# Patient Record
Sex: Male | Born: 2000 | Race: White | Hispanic: No | Marital: Single | Smoking: Former smoker
Health system: Southern US, Community
[De-identification: ages and names within clinical notes are randomized; demographics above are authoritative.]

## PROBLEM LIST (undated history)

## (undated) DIAGNOSIS — F32A Depression, unspecified: Secondary | ICD-10-CM

## (undated) DIAGNOSIS — R011 Cardiac murmur, unspecified: Secondary | ICD-10-CM

## (undated) DIAGNOSIS — K219 Gastro-esophageal reflux disease without esophagitis: Secondary | ICD-10-CM

## (undated) DIAGNOSIS — G43909 Migraine, unspecified, not intractable, without status migrainosus: Secondary | ICD-10-CM

## (undated) HISTORY — DX: Gastro-esophageal reflux disease without esophagitis: K21.9

## (undated) HISTORY — DX: Depression, unspecified: F32.A

## (undated) HISTORY — DX: Cardiac murmur, unspecified: R01.1

## (undated) HISTORY — DX: Morbid (severe) obesity due to excess calories: E66.01

## (undated) HISTORY — DX: Migraine, unspecified, not intractable, without status migrainosus: G43.909

---
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
## 2020-05-05 LAB — LIPID PANEL
Cholesterol: 136 (ref 0–200)
HDL: 37 (ref 35–70)
LDL Cholesterol: 68
Triglycerides: 186 — AB (ref 40–160)

## 2020-05-05 LAB — HEPATIC FUNCTION PANEL
ALT: 27 (ref 3–30)
AST: 18 (ref 2–40)
Alkaline Phosphatase: 126 — AB (ref 25–125)
Bilirubin, Total: 0.7

## 2020-05-05 LAB — BASIC METABOLIC PANEL
BUN: 14 (ref 4–21)
CO2: 22 (ref 13–22)
Chloride: 103 (ref 99–108)
Creatinine: 1.1 (ref 0.6–1.3)
Glucose: 75
Potassium: 4.5 (ref 3.4–5.3)
Sodium: 139 (ref 137–147)

## 2020-05-05 LAB — CBC AND DIFFERENTIAL
HCT: 48 (ref 41–53)
Hemoglobin: 16.4 (ref 13.5–17.5)
Neutrophils Absolute: 5
Platelets: 245 (ref 150–399)
WBC: 7.1

## 2020-05-05 LAB — COMPREHENSIVE METABOLIC PANEL
Albumin: 4.9 (ref 3.5–5.0)
Calcium: 9.9 (ref 8.7–10.7)
GFR calc Af Amer: 113
GFR calc non Af Amer: 97
Globulin: 2.5

## 2020-05-05 LAB — HEMOGLOBIN A1C: Hemoglobin A1C: 5.1

## 2020-05-05 LAB — CBC: RBC: 5.69 — AB (ref 3.87–5.11)

## 2020-05-05 LAB — TSH: TSH: 1.93 (ref 0.41–5.90)

## 2020-06-14 DIAGNOSIS — Z8679 Personal history of other diseases of the circulatory system: Secondary | ICD-10-CM | POA: Insufficient documentation

## 2020-08-25 ENCOUNTER — Ambulatory Visit: Payer: Self-pay | Admitting: Family Medicine

## 2020-08-28 ENCOUNTER — Other Ambulatory Visit: Payer: Self-pay

## 2020-08-30 ENCOUNTER — Ambulatory Visit (INDEPENDENT_AMBULATORY_CARE_PROVIDER_SITE_OTHER): Payer: 59 | Admitting: Family Medicine

## 2020-08-30 ENCOUNTER — Other Ambulatory Visit: Payer: Self-pay

## 2020-08-30 ENCOUNTER — Encounter: Payer: Self-pay | Admitting: Family Medicine

## 2020-08-30 DIAGNOSIS — Z952 Presence of prosthetic heart valve: Secondary | ICD-10-CM | POA: Diagnosis not present

## 2020-08-30 DIAGNOSIS — Z0000 Encounter for general adult medical examination without abnormal findings: Secondary | ICD-10-CM

## 2020-08-30 NOTE — Progress Notes (Signed)
OFFICE VISIT  08/30/2020  CC:  Chief Complaint  Patient presents with  . Establish Care    prev. PCP Dr. Sula Rumple;     HPI:    Patient is a 19 y.o. Caucasian male who presents to establish care. Hx aortic valve replacement, HTN--has [facility].  Last visit 07/2020. He got an echo, was told all was fine with it.  No f/u was discussed.  [facility] [place], Sammuel Bailiff, MD (psychiatrist). Was on guanfacine (intuniv) in the past until 2-3 wks ago. No longer feels like he wants/needs any ADHD meds. Hx of depressed mood and high anxiety but says never persistent and never treated with meds. Describes hx of panic attacks at one point, states these would occur b/c he would constantly think about something being wrong with him--says he was "hypochondriacal".  Says all this is better now, says mood not depressed lately.  05/05/20 CBC, CMET, FLP, TSH, and a1c all reviewed today (see lab section below) and all were normal.  Long hx of morbid obesity: max wt 398, lowest wt he recalls as an adult is 333. Current 345.  Says he wants to start going back to the gym as well as eating better. Has been eating a lot of junk food lately. Says his goal wt is 240 lbs.  ROS: no fevers, no CP, no SOB, no wheezing, no cough, no dizziness, no HAs, no rashes, no melena/hematochezia.  No polyuria or polydipsia.  No myalgias or arthralgias.  No focal weakness, paresthesias, or tremors.  No acute vision or hearing abnormalities. No n/v/d or abd pain.  No palpitations.     Past Medical History:  Diagnosis Date  . Attention deficit hyperactivity disorder (ADHD) 08/14/2010  . Depression   . GERD (gastroesophageal reflux disease)   . 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 Heart murmur   . Migraine   . Morbid obesity with BMI of 45.0-49.9, adult (HCC)   . Phimosis 08/14/2017    Past Surgical History:  Procedure Laterality Date  . AORTIC VALVE  REPLACEMENT  2006    Outpatient Medications Prior to Visit  Medication Sig Dispense Refill  . amoxicillin (AMOXIL) 500 MG capsule Take 4 capsules 1 hour prior to dental work.    . guanFACINE (TENEX) 1 MG tablet Take by mouth.    . Magnesium 100 MG CAPS Take by mouth.    . MELATONIN PO Take by mouth.     No facility-administered medications prior to visit.    Allergies  Allergen Reactions  . Buspar [Buspirone] Anaphylaxis    ROS As per HPI  PE: Vitals with BMI 08/30/2020  Height 5\' 11"   Weight 345 lbs  BMI 48.14  Systolic 119  Diastolic 81  Pulse 91   Gen: Alert, well appearing.  Patient is oriented to person, place, time, and situation. AFFECT: pleasant, lucid thought and speech. : no injection, icteris, swelling, or exudate.  EOMI, PERRLA. Mouth: lips without lesion/swelling.  Oral mucosa pink and moist. Oropharynx without erythema, exudate, or swelling.  CV: RRR, normal S1 and S2, no m/r/g.   LUNGS: CTA bilat, nonlabored resps, good aeration in all lung fields. ABD: soft, ND/NT EXT: no clubbing or cyanosis.  no edema.   LABS:  Lab Results  Component Value Date   TSH 1.93 05/05/2020   Lab Results  Component Value Date   WBC 7.1 05/05/2020   HGB 16.4 05/05/2020   HCT 48 05/05/2020  PLT 245 05/05/2020   Lab Results  Component Value Date   CREATININE 1.1 05/05/2020   BUN 14 05/05/2020   NA 139 05/05/2020   K 4.5 05/05/2020   CL 103 05/05/2020   CO2 22 05/05/2020   Lab Results  Component Value Date   ALT 27 05/05/2020   AST 18 05/05/2020   ALKPHOS 126 (A) 05/05/2020   Lab Results  Component Value Date   CHOL 136 05/05/2020   Lab Results  Component Value Date   HDL 37 05/05/2020   Lab Results  Component Value Date   LDLCALC 68 05/05/2020   Lab Results  Component Value Date   TRIG 186 (A) 05/05/2020   Lab Results  Component Value Date   HGBA1C 5.1 05/05/2020   IMPRESSION AND PLAN:  New/establishing care:  1) Morbid obesity,  BMI 48. Discussed impact of this on his short and longterm mental and physical health. Encouraged pt to restart healthier diet and exercise habits but start slow and ramp up over time so he can maintain TLC over the long term. Goal wt is 240 lbs.  2) Hx of AVR, age 50 or so. Says recent echo through a cardiologist in [place] (he thinks it was a [facility] practice but he cannot recall any further specifics): pt reports he was told "all was fine" with the echo. He'll need to keep annual f/u with cardiology so they can follow him and determine if/when another surveillance echo is needed. BP good today, heart sounds normal.  3) Preventative health: His most recent CPE and labs was 04/2020. He defers covid and flu vaccines at this time.  Defers HPV vaccine as well. All other vaccines UTD.  An After Visit Summary was printed and given to the patient.  FOLLOW UP: Return in about 1 year (around 08/30/2021) for annual CPE (fasting).  Signed:  Santiago Bumpers, MD           08/30/2020

## 2020-10-11 ENCOUNTER — Other Ambulatory Visit: Payer: Self-pay

## 2020-10-16 ENCOUNTER — Encounter: Payer: Self-pay | Admitting: Family Medicine

## 2020-10-16 ENCOUNTER — Other Ambulatory Visit: Payer: Self-pay

## 2020-10-16 ENCOUNTER — Ambulatory Visit (INDEPENDENT_AMBULATORY_CARE_PROVIDER_SITE_OTHER): Payer: 59 | Admitting: Family Medicine

## 2020-10-16 VITALS — BP 112/70 | HR 89 | Temp 97.7°F | Resp 16 | Ht 71.0 in | Wt 360.4 lb

## 2020-10-16 DIAGNOSIS — R1011 Right upper quadrant pain: Secondary | ICD-10-CM | POA: Diagnosis not present

## 2020-10-16 NOTE — Progress Notes (Signed)
OFFICE VISIT  10/16/2020  CC:  Chief Complaint  Patient presents with  . Follow-up    urgent care, [facility] on 11/24   HPI:    Patient is a 19 y.o. male who presents for f/u urgent care visit 10/11/20 (5 d/a). Went in for 2-3 d hx of some chest bubbly sx's, tightness in upper abd, burping.  Vague sensation of arms feeling tight.  Says he got chest x-ray and ekg was told nothing of concern was noted on these.  GER med rx'd by UC caused side effects "feeling loopy" on 1 dose so he stopped it. He no longer has the chest bubbly/tightness. Has also had RUQ pain on/off last 10d or so.  Random, not postprandial.  No nausea or vomiting.  Pain doesn't seem to last long.  No trauma to the area.  No rash.   Appetite good.  Has normal/formed bm qod, no diarrhea. No fevers.  ROS: no fevers, no CP, no SOB, no wheezing, no cough, no dizziness, no HAs, no rashes, no melena/hematochezia.  No polyuria or polydipsia.  No myalgias or arthralgias.  No focal weakness, paresthesias, or tremors.  No acute vision or hearing abnormalities. No palpitations.    Past Medical History:  Diagnosis Date  . Attention deficit hyperactivity disorder (ADHD) 08/14/2010  . Depression   . GERD (gastroesophageal reflux disease)   . 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 Heart murmur   . Migraine   . Morbid obesity with BMI of 45.0-49.9, adult (HCC)   . Phimosis 08/14/2017    Past Surgical History:  Procedure Laterality Date  . AORTIC VALVE REPLACEMENT  2006    Outpatient Medications Prior to Visit  Medication Sig Dispense Refill  . famotidine (PEPCID) 20 MG tablet Take by mouth. (Patient not taking: Reported on 10/16/2020)    . omeprazole (PRILOSEC) 20 MG capsule Take by mouth. (Patient not taking: Reported on 10/16/2020)     No facility-administered medications prior to visit.    Allergies  Allergen Reactions  . Buspar [Buspirone] Anaphylaxis    ROS As per  HPI  PE: Vitals with BMI 10/16/2020 08/30/2020  Height 5\' 11"  5\' 11"   Weight 360 lbs 6 oz 345 lbs  BMI 50.29 48.14  Systolic 112 119  Diastolic 70 81  Pulse 89 91     Gen: Alert, well appearing.  Patient is oriented to person, place, time, and situation. AFFECT: pleasant, lucid thought and speech. CV: RRR, no m/r/g.   LUNGS: CTA bilat, nonlabored resps, good aeration in all lung fields. ABD: soft, rotund but non-distended.  Mild RUQ TTP w/out rebound or guarding.  Neg murphy's sign.   ND, BS normal.  No hepatospenomegaly or mass.  No bruits. EXT: no clubbing or cyanosis.  no edema.   LABS:   none  IMPRESSION AND PLAN:  1) Epigastric/chest discomfort: question atypical dyspepsia or GERD sx's. Fortunately this has spontaneously resolved.   Obs.  2) RUQ pain, does not fit clearly with symptomatic gallstones or acute cholecystitis. Suspect abd wall etiology. However, he is definitely at higher risk for gallstones given his morbid obesity. Will check cbc, cmet, and RUQ abd ultrasound.  An After Visit Summary was printed and given to the patient.  FOLLOW UP: Return for to be determined based on results of w/u.  Signed:  , MD           10/16/2020

## 2020-10-17 LAB — CBC WITH DIFFERENTIAL/PLATELET
Basophils Absolute: 0.1 10*3/uL (ref 0.0–0.1)
Basophils Relative: 1.3 % (ref 0.0–3.0)
Eosinophils Absolute: 0.3 10*3/uL (ref 0.0–0.7)
Eosinophils Relative: 3.5 % (ref 0.0–5.0)
HCT: 46.1 % (ref 36.0–49.0)
Hemoglobin: 15.8 g/dL (ref 12.0–16.0)
Lymphocytes Relative: 19.5 % — ABNORMAL LOW (ref 24.0–48.0)
Lymphs Abs: 1.4 10*3/uL (ref 0.7–4.0)
MCHC: 34.3 g/dL (ref 31.0–37.0)
MCV: 85.8 fl (ref 78.0–98.0)
Monocytes Absolute: 0.8 10*3/uL (ref 0.1–1.0)
Monocytes Relative: 11 % (ref 3.0–12.0)
Neutro Abs: 4.8 10*3/uL (ref 1.4–7.7)
Neutrophils Relative %: 64.7 % (ref 43.0–71.0)
Platelets: 240 10*3/uL (ref 150.0–575.0)
RBC: 5.37 Mil/uL (ref 3.80–5.70)
RDW: 13.6 % (ref 11.4–15.5)
WBC: 7.4 10*3/uL (ref 4.5–13.5)

## 2020-10-17 LAB — COMPREHENSIVE METABOLIC PANEL
ALT: 23 U/L (ref 0–53)
AST: 18 U/L (ref 0–37)
Albumin: 4.3 g/dL (ref 3.5–5.2)
Alkaline Phosphatase: 103 U/L (ref 52–171)
BUN: 14 mg/dL (ref 6–23)
CO2: 33 mEq/L — ABNORMAL HIGH (ref 19–32)
Calcium: 10 mg/dL (ref 8.4–10.5)
Chloride: 100 mEq/L (ref 96–112)
Creatinine, Ser: 0.87 mg/dL (ref 0.40–1.50)
GFR: 125.16 mL/min (ref >60.00)
Glucose, Bld: 72 mg/dL (ref 70–99)
Potassium: 3.8 mEq/L (ref 3.5–5.1)
Sodium: 139 mEq/L (ref 135–145)
Total Bilirubin: 0.5 mg/dL (ref 0.2–1.2)
Total Protein: 7.5 g/dL (ref 6.0–8.3)

## 2020-10-19 ENCOUNTER — Ambulatory Visit: Payer: 59

## 2020-10-19 ENCOUNTER — Other Ambulatory Visit: Payer: Self-pay | Admitting: Family Medicine

## 2020-10-19 ENCOUNTER — Ambulatory Visit (INDEPENDENT_AMBULATORY_CARE_PROVIDER_SITE_OTHER): Payer: 59

## 2020-10-19 ENCOUNTER — Other Ambulatory Visit: Payer: Self-pay

## 2020-10-19 DIAGNOSIS — R1011 Right upper quadrant pain: Secondary | ICD-10-CM

## 2021-01-10 DIAGNOSIS — G4733 Obstructive sleep apnea (adult) (pediatric): Secondary | ICD-10-CM | POA: Insufficient documentation

## 2021-05-12 ENCOUNTER — Other Ambulatory Visit: Payer: Self-pay

## 2021-05-12 ENCOUNTER — Emergency Department (HOSPITAL_BASED_OUTPATIENT_CLINIC_OR_DEPARTMENT_OTHER)
Admission: EM | Admit: 2021-05-12 | Discharge: 2021-05-12 | Disposition: A | Discharge status: Home / Self Care | Payer: 59 | Attending: Emergency Medicine | Admitting: Emergency Medicine

## 2021-05-12 ENCOUNTER — Emergency Department (HOSPITAL_BASED_OUTPATIENT_CLINIC_OR_DEPARTMENT_OTHER): Payer: 59

## 2021-05-12 ENCOUNTER — Encounter (HOSPITAL_BASED_OUTPATIENT_CLINIC_OR_DEPARTMENT_OTHER): Payer: Self-pay | Admitting: Emergency Medicine

## 2021-05-12 DIAGNOSIS — F17220 Nicotine dependence, chewing tobacco, uncomplicated: Secondary | ICD-10-CM | POA: Insufficient documentation

## 2021-05-12 DIAGNOSIS — E039 Hypothyroidism, unspecified: Secondary | ICD-10-CM | POA: Diagnosis not present

## 2021-05-12 DIAGNOSIS — Z28310 Unvaccinated for covid-19: Secondary | ICD-10-CM | POA: Insufficient documentation

## 2021-05-12 DIAGNOSIS — Z20822 Contact with and (suspected) exposure to covid-19: Secondary | ICD-10-CM | POA: Insufficient documentation

## 2021-05-12 DIAGNOSIS — R059 Cough, unspecified: Secondary | ICD-10-CM | POA: Diagnosis present

## 2021-05-12 DIAGNOSIS — J40 Bronchitis, not specified as acute or chronic: Secondary | ICD-10-CM | POA: Insufficient documentation

## 2021-05-12 LAB — RESP PANEL BY RT-PCR (FLU A&B, COVID) ARPGX2
Influenza A by PCR: NEGATIVE
Influenza B by PCR: NEGATIVE
SARS Coronavirus 2 by RT PCR: NEGATIVE

## 2021-05-12 MED ORDER — PREDNISONE 20 MG PO TABS: 40.0000 mg | ORAL_TABLET | Freq: Every day | ORAL | 0 refills | Status: AC

## 2021-05-12 MED ORDER — ALBUTEROL SULFATE HFA 108 (90 BASE) MCG/ACT IN AERS
2.0000 | INHALATION_SPRAY | Freq: Once | RESPIRATORY_TRACT | Status: AC
  Administered 2021-05-12: 2 via RESPIRATORY_TRACT
  Filled 2021-05-12: qty 6.7

## 2021-05-12 NOTE — ED Triage Notes (Signed)
Reports SOB, weakness, fatigue, occasional cough, and sore throat from coughing.

## 2021-05-12 NOTE — Discharge Instructions (Addendum)
Your history and physical exam is suggestive of bronchitis.  You have been tested for COVID-19 and influenza which are still pending.  Chest x-ray is without any evidence of pneumonia.  You do not need to be treated with antibiotics.  Instead, continue with albuterol every 4 hours as needed.  You will need to discontinue smoking, at least until your symptoms improve.  I have also prescribed you a short prednisone burst which you can take to help with your bronchial inflammation.

## 2021-05-12 NOTE — Progress Notes (Signed)
RN gave albuterol treatment to patient.

## 2021-05-12 NOTE — ED Provider Notes (Signed)
[facility] EMERGENCY DEPARTMENT Provider Note   CSN: [id number] Arrival date & time: 05/12/21  1836     History Chief Complaint  Patient presents with   Shortness of Breath    Marco Davis is a 20 y.o. male with no relevant past medical history presents the ED with complaints of cold symptoms.  On my examination, patient states that for the past 10 days he has been experiencing cold symptoms.  He endorses headache, congestion, generalized malaise, and shortness of breath in the context of a nonproductive cough.  He denies any fevers, chills, abdominal pain, dysuria, room spinning dizziness, numbness or weakness, hemoptysis, unilateral extremity swelling or edema, history of clots or clotting disorder, or other symptoms.  He states that it started after he went to his 54-year-old cousin's birthday party.  Denies any obvious sick contacts though.  He is not immunized for COVID-19.  He is traveling to [place] tomorrow and wanted to get checked out.  Patient smokes vapes and marijuana on daily basis.  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   Heart murmur    Migraine    Morbid obesity with BMI of 45.0-49.9, adult (HCC)    Phimosis 08/14/2017    Patient Active Problem List   Diagnosis Date Noted   History of aortic valve disorder 06/14/2020   Acquired hypothyroidism 05/17/2019   Phimosis 08/14/2017   Attention deficit hyperactivity disorder (ADHD) 08/14/2010   Obesity, unspecified 01/02/2009    Past Surgical History:  Procedure Laterality Date   AORTIC VALVE REPLACEMENT  2006       Family History  Problem Relation Age of Onset   Hypertension Mother    Arthritis Maternal Grandmother    Hypertension Maternal Grandmother    COPD Maternal Grandmother    Mental  illness Maternal Grandmother    Heart disease Maternal Grandfather    Breast cancer Paternal Grandmother    COPD Paternal Grandmother     Social History   Tobacco Use   Smoking status: Never   Smokeless tobacco: Current    Types: Chew  Vaping Use   Vaping Use: Former   Quit date: 07/31/2020  Substance Use Topics   Alcohol use: Yes    Alcohol/week: 2.0 standard drinks    Types: 2 Cans of beer per week   Drug use: Yes    Types: Marijuana    Comment: former LSD    Home Medications Prior to Admission medications   Medication Sig Start Date End Date Taking? Authorizing Provider  predniSONE (DELTASONE) 20 MG tablet Take 2 tablets (40 mg total) by mouth daily with breakfast for 5 days. 05/12/21 05/17/21 Yes Lorelee New, PA-C  famotidine (PEPCID) 20 MG tablet Take by mouth. Patient not taking: Reported on 10/16/2020 10/11/20 11/10/20  [provider]  omeprazole (PRILOSEC) 20 MG capsule Take by mouth. Patient not taking: Reported on 10/16/2020 10/11/20 11/10/20  [provider]    Allergies    Buspar [buspirone]  Review of Systems   Review of Systems  All other systems reviewed and are negative.  Physical Exam Updated Vital Signs BP 120/75 (BP Location: Right Arm)   Pulse 68   Temp 98.4 F (36.9 C) (Oral)   Resp 18   Ht 6\' 3"  (1.905 m)   Wt )  149.7 kg   SpO2 98%   BMI 41.25 kg/m   Physical Exam Vitals and nursing note reviewed. Exam conducted with a chaperone present.  Constitutional:      General: He is not in acute distress.    Appearance: Normal appearance. He is not toxic-appearing.  HENT:     Head: Normocephalic and atraumatic.  Eyes:     General: No scleral icterus.    Conjunctiva/sclera: Conjunctivae normal.  Cardiovascular:     Rate and Rhythm: Normal rate.     Pulses: Normal pulses.  Pulmonary:     Effort: Pulmonary effort is normal. No respiratory distress.     Breath sounds: Wheezing present. No rales.     Comments: Mild  wheezing auscultated bilaterally.  No increased work of breathing.  No tachypnea. Musculoskeletal:     Right lower leg: No edema.     Left lower leg: No edema.  Skin:    General: Skin is dry.  Neurological:     Mental Status: He is alert and oriented to person, place, and time.     GCS: GCS eye subscore is 4. GCS verbal subscore is 5. GCS motor subscore is 6.  Psychiatric:        Mood and Affect: Mood normal.        Behavior: Behavior normal.        Thought Content: Thought content normal.    ED Results / Procedures / Treatments   Labs (all labs ordered are listed, but only abnormal results are displayed) Labs Reviewed  RESP PANEL BY RT-PCR (FLU A&B, COVID) [id number]    EKG None  Radiology DG Chest Portable 1 View  Result Date: 05/12/2021 CLINICAL DATA:  Shortness of breath, cough, weakness and fatigue EXAM: PORTABLE CHEST 1 VIEW COMPARISON:  None. FINDINGS: Evidence of remote prior sternotomy with multiple sternal sutures. Linear radiodensity projecting in the region of the aortic valve likely corresponding to prior aortic valve replacement. Remaining cardiomediastinal contours are unremarkable. No consolidation, features of edema, pneumothorax, or effusion. No acute osseous or soft tissue abnormality. IMPRESSION: No acute cardiopulmonary abnormality. Evidence of prior sternotomy and aortic valve replacement. Electronically Signed   By: Kreg Shropshire M.D.   On: 05/12/2021 20:00    Procedures Procedures   Medications Ordered in ED Medications  albuterol (VENTOLIN HFA) 108 (90 Base) MCG/ACT inhaler 2 puff (2 puffs Inhalation Given 05/12/21 1928)    ED Course  I have reviewed the triage vital signs and the nursing notes.  Pertinent labs & imaging results that were available during my care of the patient were reviewed by me and considered in my medical decision making (see chart for details).    MDM Rules/Calculators/A&P                          Marco Davis was evaluated in  Emergency Department on 05/12/2021 for the symptoms described in the history of present illness. He was evaluated in the context of the global COVID-19 pandemic, which necessitated consideration that the patient might be at risk for infection with the SARS-CoV-2 virus that causes COVID-19. Institutional protocols and algorithms that pertain to the evaluation of patients at risk for COVID-19 are in a state of rapid change based on information released by regulatory bodies including the CDC and federal and state organizations. These policies and algorithms were followed during the patient's care in the ED.  I personally reviewed patient's medical chart and all notes from triage and  staff during today's encounter. I have also ordered and reviewed all labs and imaging that I felt to be medically necessary in the evaluation of this patient's complaints and with consideration of their physical exam. If needed, translation services were available and utilized.   Patient with 10-day history of cold symptoms.  He continues to smoke on a regular, daily basis.  There was mild wheezing appreciated my exam.  Given chronicity of illness, will obtain chest x-ray.  Patient is unvaccinated, will also obtain COVID-19 testing.  He denies any fevers, chills, or difficulty tolerating p.o. intake.  Do not feel as though further laboratory work-up would yield any significant findings.  Patient is PERC negative.     Chest x-ray is personally reviewed and demonstrates no acute cardiopulmonary disease.  Given patient's mild wheezing auscultated bilaterally, will treat with albuterol here in the ED.  We will discharge him home with continued albuterol use in addition to short prednisone burst.  I emphasized to him that antibiotics are not warranted and would not help with his recovery.    I also told patient to abstain from continued smoking until his symptoms improve.  This is likely contributing to his persistent shortness of  breath and wheezing.    On subsequent evaluation, he is feeling improved after albuterol here in the ED.  He has used it in the past and feels comfortable continuing treatments at home.  He can follow-up with his primary care provider for ongoing evaluation and management.  ER return precautions discussed.  Patient voices understanding and is agreeable to the plan.  Final Clinical Impression(s) / ED Diagnoses Final diagnoses:  Bronchitis    Rx / DC Orders ED Discharge Orders          Ordered    predniSONE (DELTASONE) 20 MG tablet  Daily with breakfast        05/12/21 2005             Lorelee New, PA-C 05/12/21 2005    Little, Ambrose Finland, MD 05/12/21 2320

## 2021-06-19 ENCOUNTER — Emergency Department (HOSPITAL_BASED_OUTPATIENT_CLINIC_OR_DEPARTMENT_OTHER): Payer: 59

## 2021-06-19 ENCOUNTER — Emergency Department (HOSPITAL_BASED_OUTPATIENT_CLINIC_OR_DEPARTMENT_OTHER)
Admission: EM | Admit: 2021-06-19 | Discharge: 2021-06-19 | Disposition: A | Discharge status: Home / Self Care | Payer: 59 | Attending: Emergency Medicine | Admitting: Emergency Medicine

## 2021-06-19 ENCOUNTER — Other Ambulatory Visit: Payer: Self-pay

## 2021-06-19 ENCOUNTER — Encounter (HOSPITAL_BASED_OUTPATIENT_CLINIC_OR_DEPARTMENT_OTHER): Payer: Self-pay

## 2021-06-19 ENCOUNTER — Telehealth: Payer: Self-pay

## 2021-06-19 DIAGNOSIS — R600 Localized edema: Secondary | ICD-10-CM | POA: Diagnosis present

## 2021-06-19 DIAGNOSIS — Z87891 Personal history of nicotine dependence: Secondary | ICD-10-CM | POA: Insufficient documentation

## 2021-06-19 DIAGNOSIS — R0789 Other chest pain: Secondary | ICD-10-CM | POA: Diagnosis not present

## 2021-06-19 DIAGNOSIS — R233 Spontaneous ecchymoses: Secondary | ICD-10-CM | POA: Diagnosis not present

## 2021-06-19 DIAGNOSIS — M7989 Other specified soft tissue disorders: Secondary | ICD-10-CM

## 2021-06-19 DIAGNOSIS — R079 Chest pain, unspecified: Secondary | ICD-10-CM

## 2021-06-19 DIAGNOSIS — E039 Hypothyroidism, unspecified: Secondary | ICD-10-CM | POA: Diagnosis not present

## 2021-06-19 LAB — CBC WITH DIFFERENTIAL/PLATELET
Abs Immature Granulocytes: 0.06 10*3/uL (ref 0.00–0.07)
Basophils Absolute: 0 10*3/uL (ref 0.0–0.1)
Basophils Relative: 0 %
Eosinophils Absolute: 0.2 10*3/uL (ref 0.0–0.5)
Eosinophils Relative: 2 %
HCT: 45.3 % (ref 39.0–52.0)
Hemoglobin: 15.2 g/dL (ref 13.0–17.0)
Immature Granulocytes: 1 %
Lymphocytes Relative: 24 %
Lymphs Abs: 1.6 10*3/uL (ref 0.7–4.0)
MCH: 29.3 pg (ref 26.0–34.0)
MCHC: 33.6 g/dL (ref 30.0–36.0)
MCV: 87.5 fL (ref 80.0–100.0)
Monocytes Absolute: 0.6 10*3/uL (ref 0.1–1.0)
Monocytes Relative: 8 %
Neutro Abs: 4.2 10*3/uL (ref 1.7–7.7)
Neutrophils Relative %: 65 %
Platelets: 257 10*3/uL (ref 150–400)
RBC: 5.18 MIL/uL (ref 4.22–5.81)
RDW: 13.1 % (ref 11.5–15.5)
WBC: 6.6 10*3/uL (ref 4.0–10.5)
nRBC: 0 % (ref 0.0–0.2)

## 2021-06-19 LAB — COMPREHENSIVE METABOLIC PANEL
ALT: 30 U/L (ref 0–44)
AST: 23 U/L (ref 15–41)
Albumin: 4 g/dL (ref 3.5–5.0)
Alkaline Phosphatase: 86 U/L (ref 38–126)
Anion gap: 7 (ref 5–15)
BUN: 12 mg/dL (ref 6–20)
CO2: 28 mmol/L (ref 22–32)
Calcium: 9.2 mg/dL (ref 8.9–10.3)
Chloride: 102 mmol/L (ref 98–111)
Creatinine, Ser: 0.85 mg/dL (ref 0.61–1.24)
GFR, Estimated: >60 mL/min (ref >60)
Glucose, Bld: 85 mg/dL (ref 70–99)
Potassium: 4 mmol/L (ref 3.5–5.1)
Sodium: 137 mmol/L (ref 135–145)
Total Bilirubin: 0.5 mg/dL (ref 0.3–1.2)
Total Protein: 7.3 g/dL (ref 6.5–8.1)

## 2021-06-19 LAB — D-DIMER, QUANTITATIVE: D-Dimer, Quant: 0.32 ug/mL-FEU (ref 0.00–0.50)

## 2021-06-19 LAB — TROPONIN I (HIGH SENSITIVITY): Troponin I (High Sensitivity): 4 ng/L (ref <18)

## 2021-06-19 NOTE — ED Triage Notes (Signed)
Pt c/o CP x today-swelling to right LE x 5 days-NAD-steady gait

## 2021-06-19 NOTE — ED Notes (Signed)
Went in to discharge pt. Gown and monitoring devices lying on bed. Pt not in room

## 2021-06-19 NOTE — Telephone Encounter (Signed)
Pt currently at [facility] ED, still has scheduled appt with PCP for 8/3  [facility] Day - Client TELEPHONE ADVICE RECORD [employer] Patient Name: Marco Davis Gender: Male DOB: May 16, 2001 Age: 20 Y 1 M 5 D Return Phone Number: [phone] (Primary) Address: City/ State/ Zip: [place]  [address] Client [facility] Day - Client Client Site [place] [facility] [place] - Day Contact Type Call Who Is Calling Patient / Member / Family / Caregiver Call Type Triage / Clinical Relationship To Patient Self Return Phone Number [phone] (Primary) Chief Complaint Leg Swelling And Edema Reason for Call Symptomatic / Request for Health Information Initial Comment Caller states that he has swelling in his right leg. Translation No Nurse Assessment Nurse: Marco Luz, RN, Marco Davis Date/Time (Eastern Time): 06/19/2021 2:45:07 PM Confirm and document reason for call. If symptomatic, describe symptoms. ---Caller states that he has swelling in his right leg. Has red and purple spots along Rt shin. No injury to report. Limited to anterior LLE. Does the patient have any new or worsening symptoms? ---Yes Will a triage be completed? ---Yes Related visit to physician within the last 2 weeks? ---No Does the PT have any chronic conditions? (i.e. diabetes, asthma, this includes High risk factors for pregnancy, etc.) ---Yes List chronic conditions. ---Hx/o aortic valve replaced in childhood. Is this a behavioral health or substance abuse call? ---No Guidelines Guideline Title Affirmed Question Affirmed Notes Nurse Date/Time Marco Davis Time) Leg Swelling and Edema [1] MODERATE leg swelling (e.g., swelling extends up to knees) AND [2] new-onset or worsening Marco Davis, RNAurther Davis 06/19/2021 2:49:16 PM Disp. Time Marco Davis Time) Disposition Final User 06/19/2021 2:32:13 PM Attempt made - message left Bogard, RN, Marco Davis 06/19/2021 2:55:56 PM See PCP within  24 Hours Yes Bogard, RN, Marco Davis PLEASE NOTE: All timestamps contained within this report are represented as [place] Standard Time. CONFIDENTIALTY NOTICE: This fax transmission is intended only for the addressee. It contains information that is legally privileged, confidential or otherwise protected from use or disclosure. If you are not the intended recipient, you are strictly prohibited from reviewing, disclosing, copying using or disseminating any of this information or taking any action in reliance on or regarding this information. If you have received this fax in error, please notify us immediately by telephone so that we can arrange for its return to [place]. Phone: [phone], Toll-Free: [phone], Fax: [phone] Page: 2 of 2 Call Id: [id number] Caller Disagree/Comply Comply Caller Understands Yes PreDisposition Did not know what to do Care Advice Given Per Guideline * IF OFFICE WILL BE OPEN: You need to be examined within the next 24 hours. Call your doctor (or NP/PA) when the office opens and make an appointment. LEG SWELLING OR EDEMA: * Elevate your legs or try to lie down one or two times a day for 20 minutes. * Avoid socks with an elastic band at the top. * Wear comfortable shoes. CALL BACK IF: * Breathing difficulty or chest pain occurs * You become worse SEE PCP WITHIN 24 HOURS: Referrals REFERRED TO PCP OFFICE

## 2021-06-19 NOTE — ED Provider Notes (Signed)
[facility] EMERGENCY DEPARTMENT Provider Note   CSN: [id number] Arrival date & time: 06/19/21  1532     History Chief Complaint  Patient presents with   Chest Pain   Leg Swelling    Marco Davis is a 20 y.o. male.  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  Patient reports that this is been progressively getting worse for the past 5 days.  Patient reports some bruising to the area.  Patient was concerned possible blood clot.  No injuries that he knows of to the area.  Patient was reports yesterday he developed some left-sided chest pain that is worse with palpation of the anterior chest wall.  Patient reports that he is not have any pain currently.  Denies any cough or fevers.  No nausea or vomiting.  No history of blood clots.  Patient denies any tobacco use.  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   Heart murmur    Migraine    Morbid obesity with BMI of 45.0-49.9, adult (HCC)    Phimosis 08/14/2017    Patient Active Problem List   Diagnosis Date Noted   Mild obstructive sleep apnea 01/10/2021   History of aortic valve disorder 06/14/2020   Acquired hypothyroidism 05/17/2019   Phimosis 08/14/2017   Attention deficit hyperactivity disorder (ADHD) 08/14/2010   Obesity, unspecified 01/02/2009    Past Surgical History:  Procedure Laterality Date   AORTIC VALVE REPLACEMENT  2006       Family History  Problem Relation Age of  Onset   Hypertension Mother    Arthritis Maternal Grandmother    Hypertension Maternal Grandmother    COPD Maternal Grandmother    Mental illness Maternal Grandmother    Heart disease Maternal Grandfather    Breast cancer Paternal Grandmother    COPD Paternal Grandmother     Social History   Tobacco Use   Smoking status: Former   Smokeless tobacco: Former  Building services engineerVaping Use   Vaping Use: Former   Quit date: 07/31/2020  Substance Use Topics   Alcohol use: Yes    Alcohol/week: 2.0 standard drinks    Types: 2 Cans of beer per week    Comment: weekly   Drug use: Yes    Types: Marijuana    Home Medications Prior to Admission medications   Medication Sig Start Date End Date Taking? Authorizing Provider  cetirizine (ZYRTEC) 10 MG tablet Take by mouth. 02/08/21   [provider]  famotidine (PEPCID) 20 MG tablet Take by mouth. Patient not taking: Reported on 10/16/2020 10/11/20 11/10/20  [provider]  fluticasone (FLONASE) 50 MCG/ACT nasal spray Place into the nose. 02/08/21   [provider]  omeprazole (PRILOSEC) 20 MG capsule Take by mouth. Patient not taking: Reported on 10/16/2020 10/11/20 11/10/20  [provider]    Allergies    Buspar [buspirone]  Review of Systems   Review of Systems  Constitutional:  Negative for chills, diaphoresis and fever.  HENT:  Negative for congestion.  Eyes:  Negative for discharge.  Respiratory:  Negative for cough and shortness of breath.   Cardiovascular:  Positive for chest pain and leg swelling. Negative for palpitations.  Gastrointestinal:  Negative for abdominal pain, diarrhea, nausea and vomiting.  Musculoskeletal:  Negative for arthralgias.  Skin:  Negative for color change.  Neurological:  Negative for headaches.  Psychiatric/Behavioral:  Negative for confusion.    Physical Exam Updated Vital Signs BP 125/75 (BP Location: Left Arm)   Pulse 65   Temp 98.3 F (36.8 C) (Oral)   Resp 18   Ht  6\' 3"  (1.905 m)   Wt (!) 150.6 kg   SpO2 95%   BMI 41.50 kg/m   Physical Exam Vitals and nursing note reviewed.  Constitutional:      General: He is not in acute distress.    Appearance: He is well-developed. He is not ill-appearing or toxic-appearing.  HENT:     Head: Normocephalic and atraumatic.  Eyes:     General: No scleral icterus.       Right eye: No discharge.        Left eye: No discharge.  Cardiovascular:     Pulses:          Radial pulses are 2+ on the right side and 2+ on the left side.       Dorsalis pedis pulses are 2+ on the right side and 2+ on the left side.     Heart sounds: No murmur heard.   No friction rub. No gallop.  Pulmonary:     Effort: Pulmonary effort is normal. No respiratory distress.     Breath sounds: Normal breath sounds.  Musculoskeletal:        General: Normal range of motion.     Cervical back: Normal range of motion.     Comments: Patient does have some mild edema of the right lower extremity.  There is no calf tenderness palpable cord.  Does have some mild ecchymosis over the anterior tib-fib.  Patient has no erythema or warmth that I can appreciate.  DP pulses are 2+.  Sensation is intact.  Full range of motion of all joints  Skin:    General: Skin is warm and dry.     Capillary Refill: Capillary refill takes less than 2 seconds.     Coloration: Skin is not pale.  Neurological:     Mental Status: He is alert.  Psychiatric:        Behavior: Behavior normal.        Thought Content: Thought content normal.        Judgment: Judgment normal.    ED Results / Procedures / Treatments   Labs (all labs ordered are listed, but only abnormal results are displayed) Labs Reviewed  CBC WITH DIFFERENTIAL/PLATELET  COMPREHENSIVE METABOLIC PANEL  D-DIMER, QUANTITATIVE  TROPONIN I (HIGH SENSITIVITY)  TROPONIN I (HIGH SENSITIVITY)    EKG EKG Interpretation  Date/Time:  Tuesday June 19 2021 15:42:38 EDT Ventricular Rate:  63 PR  Interval:  166 QRS Duration: 94 QT Interval:  404 QTC Calculation: 413 R Axis:   51 Text Interpretation: Normal sinus rhythm Normal ECG No old tracing to compare Confirmed by [id number] [phone]) on 06/19/2021 4:39:07 PM  Radiology DG Chest 2 View  Result Date: 06/19/2021 CLINICAL DATA:  Chest pain. Patient reports right lower extremity swelling. EXAM: CHEST - 2 VIEW COMPARISON:  Radiograph 05/12/2021 FINDINGS: Remote median sternotomy. Upper normal heart size with stable mediastinal contours. No pulmonary edema,  pleural effusion, focal airspace disease or pneumothorax. No acute osseous abnormalities are seen. IMPRESSION: 1. No acute chest findings. 2. Post remote median sternotomy. Electronically Signed   By: Narda Rutherford M.D.   On: 06/19/2021 19:17   US Venous Img Lower Unilateral Right  Result Date: 06/19/2021 CLINICAL DATA:  Right leg swelling and pain. EXAM: RIGHT LOWER EXTREMITY VENOUS DOPPLER ULTRASOUND TECHNIQUE: Gray-scale sonography with compression, as well as color and duplex ultrasound, were performed to evaluate the deep venous system(s) from the level of the common femoral vein through the popliteal and proximal calf veins. COMPARISON:  None. FINDINGS: VENOUS Normal compressibility of the common femoral, superficial femoral, and popliteal veins, as well as the visualized calf veins. Visualized portions of profunda femoral vein and great saphenous vein unremarkable. No filling defects to suggest DVT on grayscale or color Doppler imaging. Doppler waveforms show normal direction of venous flow, normal respiratory plasticity and response to augmentation. Limited views of the contralateral common femoral vein are unremarkable. OTHER None. Limitations: none IMPRESSION: No evidence of right lower extremity deep venous thrombosis. Electronically Signed   By: Jeronimo Greaves M.D.   On: 06/19/2021 18:43    Procedures Procedures   Medications Ordered in ED Medications - No data to display  ED  Course  I have reviewed the triage vital signs and the nursing notes.  Pertinent labs & imaging results that were available during my care of the patient were reviewed by me and considered in my medical decision making (see chart for details).    MDM Rules/Calculators/A&P                           20 year old presents for right leg pain and swelling.  Patient also ports some intermittent left-sided chest pain.  Patient does have cardiac history documented above.  On exam patient is very well-appearing.  Neurovascularly intact in all extremities.  Patient does have some mild edema of the right calf but no erythema, warmth.  The ultrasound was obtained that shows no evidence of DVT to the right lower extremity.  Patient had EKG that was reviewed shows normal sinus rhythm no ischemic changes appreciated there is no signs of any worrisome arrhythmias or prolonged intervals.  Chest x-ray was reviewed that shows no evidence of focal infiltrate concerning for pneumonia.  I have this patient for ACS at this time.  No signs of fluid overloaded including bilateral pitting edema or pulmonary edema.  Unsure of the etiology of patient's right leg swelling.  May be lymphedema.  Patient was encouraged to use compression stockings and elevation at home.  D-dimer was negative.  Low suspicion for PE.  Patient reports this chest pain is completely resolved.  Encourage patient to follow-up with his cardiologist and primary care doctor.  Patient's lower extremities not appear to be cellulitic in nature.  No indication for antibiotics.  Pt is hemodynamically stable, in NAD, & able to ambulate in the ED. Evaluation does not show pathology that would require ongoing emergent intervention or inpatient treatment. I explained the diagnosis to the patient. Pain has been managed & has no complaints prior to dc. Pt is comfortable with above plan and is stable for discharge at this time. All questions were answered prior to  disposition. Strict return precautions for f/u to the ED were discussed. Encouraged follow up with PCP.  Final Clinical Impression(s) / ED Diagnoses Final diagnoses:  Right leg swelling  Left-sided chest pain  Rx / DC Orders ED Discharge Orders     None        Wallace Keller 06/19/21 1937    Melene Plan, DO 06/19/21 2300

## 2021-06-19 NOTE — Discharge Instructions (Addendum)
Your work-up was reassuring today.  No signs of heart attack or blood clot.  Use the compression stockings help with your leg swelling.  Elevate as well.  If you have any worsening chest pain or shortness breath return to the ER make she follow-up with a cardiologist.

## 2021-06-20 ENCOUNTER — Ambulatory Visit: Payer: 59 | Admitting: Family Medicine

## 2021-06-20 NOTE — Progress Notes (Deleted)
OFFICE VISIT  06/20/2021  CC: No chief complaint on file.  HPI:    Patient is a 20 y.o. male who presents for "leg swelling". ED visit documentation for same complaint 06/19/21 reviewed today. CBC, CMET, troponins, D dimer and CXR all normal.  LE venous doppler u/s NEG for DVT.    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     famotidine (PEPCID) 20 MG tablet Take by mouth. (Patient not taking: Reported on 10/16/2020)     fluticasone (FLONASE) 50 MCG/ACT nasal spray Place into the nose.     omeprazole (PRILOSEC) 20 MG capsule Take by mouth. (Patient not taking: Reported on 10/16/2020)     No facility-administered medications prior to visit.    Allergies  Allergen Reactions   Buspar [Buspirone] Anaphylaxis    ROS As per HPI  PE: Vitals with BMI 06/19/2021 06/19/2021 06/19/2021  Height - - -  Weight - - -  BMI - - -  Systolic 122 117 892  Diastolic 77 69 81  Pulse 74 65 66     ***  LABS:    Chemistry      Component Value Date/Time   NA 137 06/19/2021 1700   NA 139 05/05/2020 0000   K 4.0 06/19/2021 1700   CL 102 06/19/2021 1700   CO2 28 06/19/2021 1700   BUN 12 06/19/2021 1700   BUN 14 05/05/2020 0000   CREATININE 0.85 06/19/2021 1700   GLU 75 05/05/2020 0000      Component Value Date/Time   CALCIUM 9.2 06/19/2021 1700   ALKPHOS 86 06/19/2021 1700   AST 23 06/19/2021 1700   ALT 30 06/19/2021 1700   BILITOT 0.5 06/19/2021 1700     Lab Results  Component Value  Date   DDIMER 0.32 06/19/2021   Lab Results  Component Value Date   WBC 6.6 06/19/2021   HGB 15.2 06/19/2021   HCT 45.3 06/19/2021   MCV 87.5 06/19/2021   PLT 257 06/19/2021    IMPRESSION AND PLAN:  No problem-specific Assessment & Plan notes found for this encounter.   An After Visit Summary was printed and given to the patient.  FOLLOW UP: No follow-ups on file.  Signed:  Santiago Bumpers, MD           06/20/2021

## 2021-08-28 IMAGING — DX DG CHEST 2V
2 series · 2 of 2 positions shown · non-contrast
Comparison: Radiograph 05/12/2021

CLINICAL DATA: Chest pain. Patient reports right lower extremity
swelling.

EXAM:
CHEST - 2 VIEW

[chest pa]
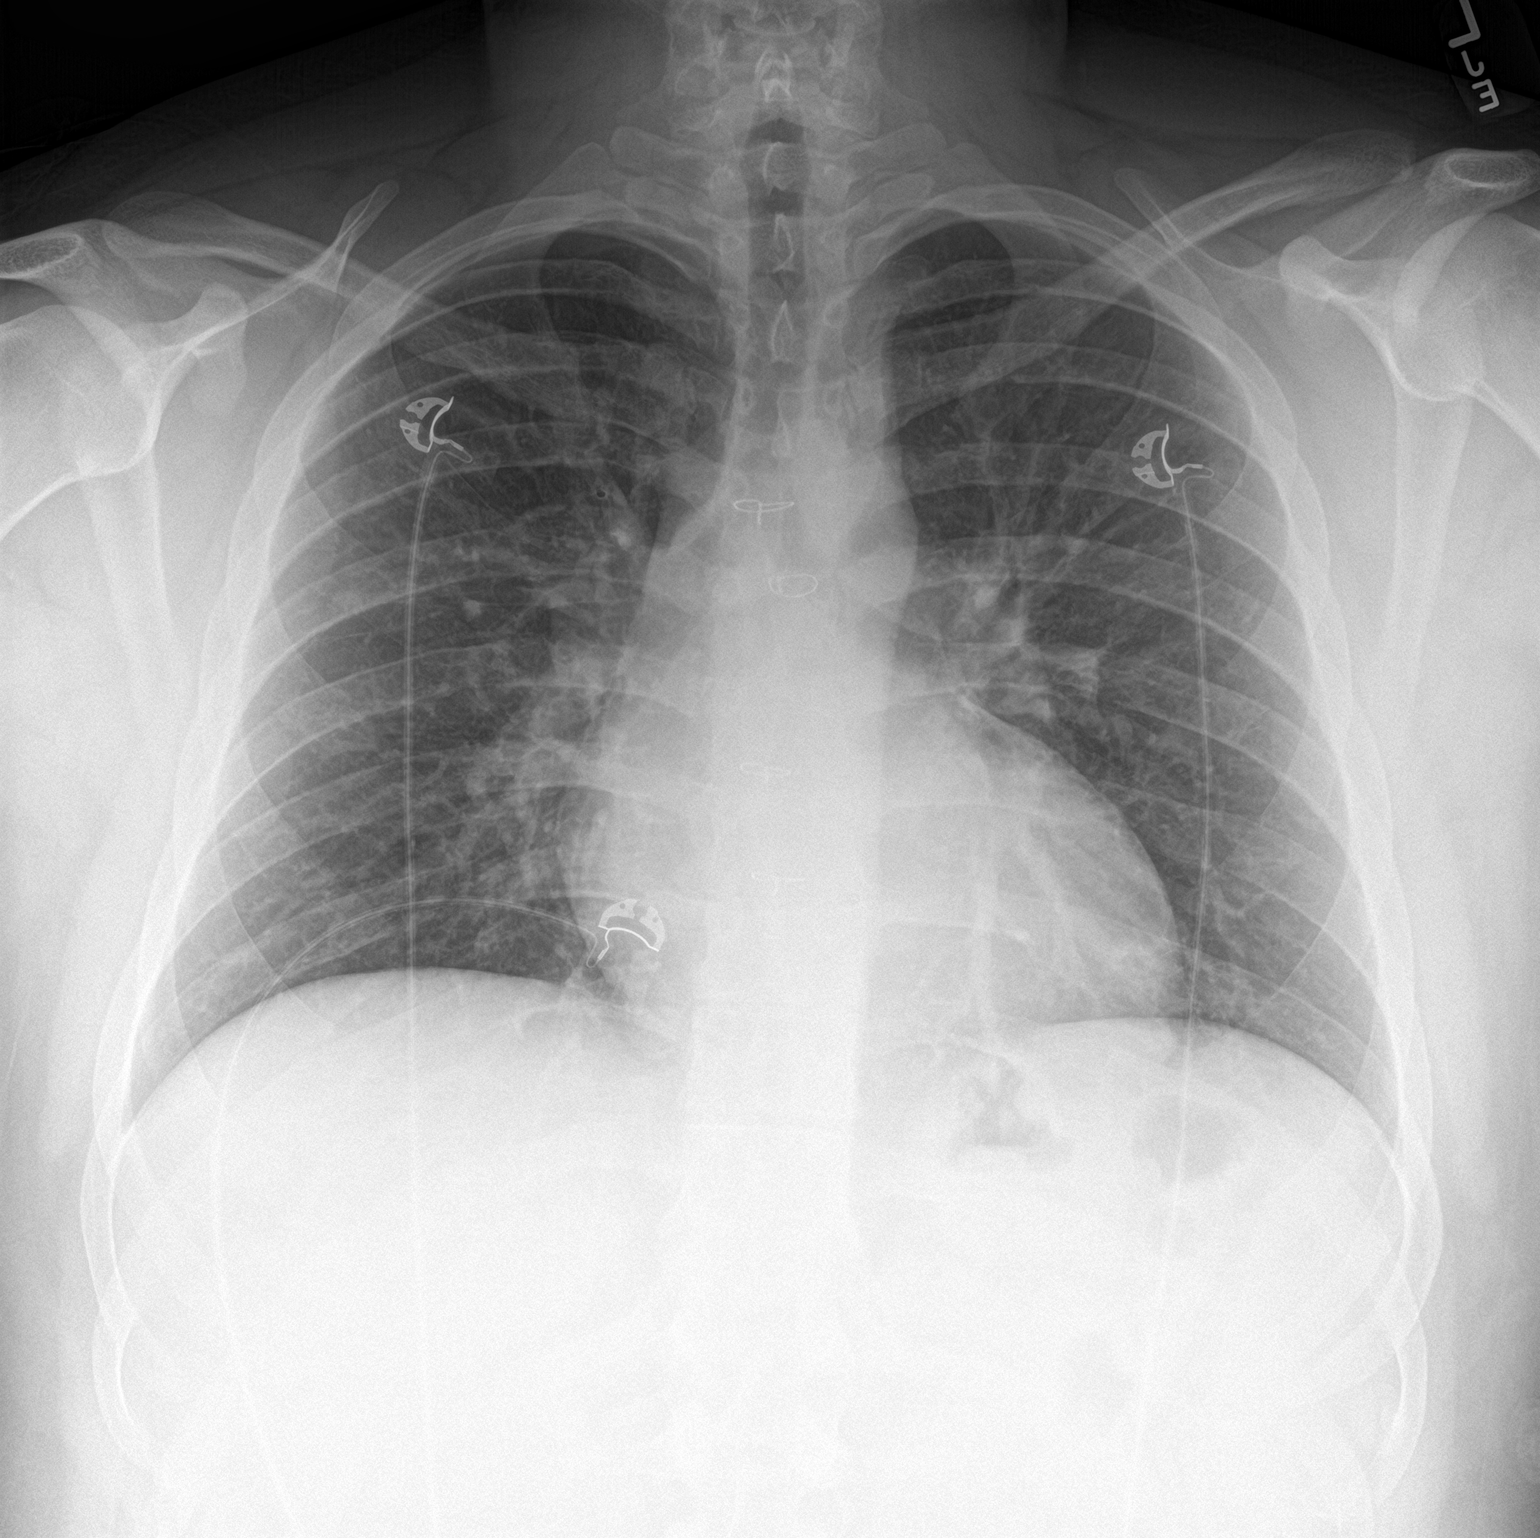

[chest lat]
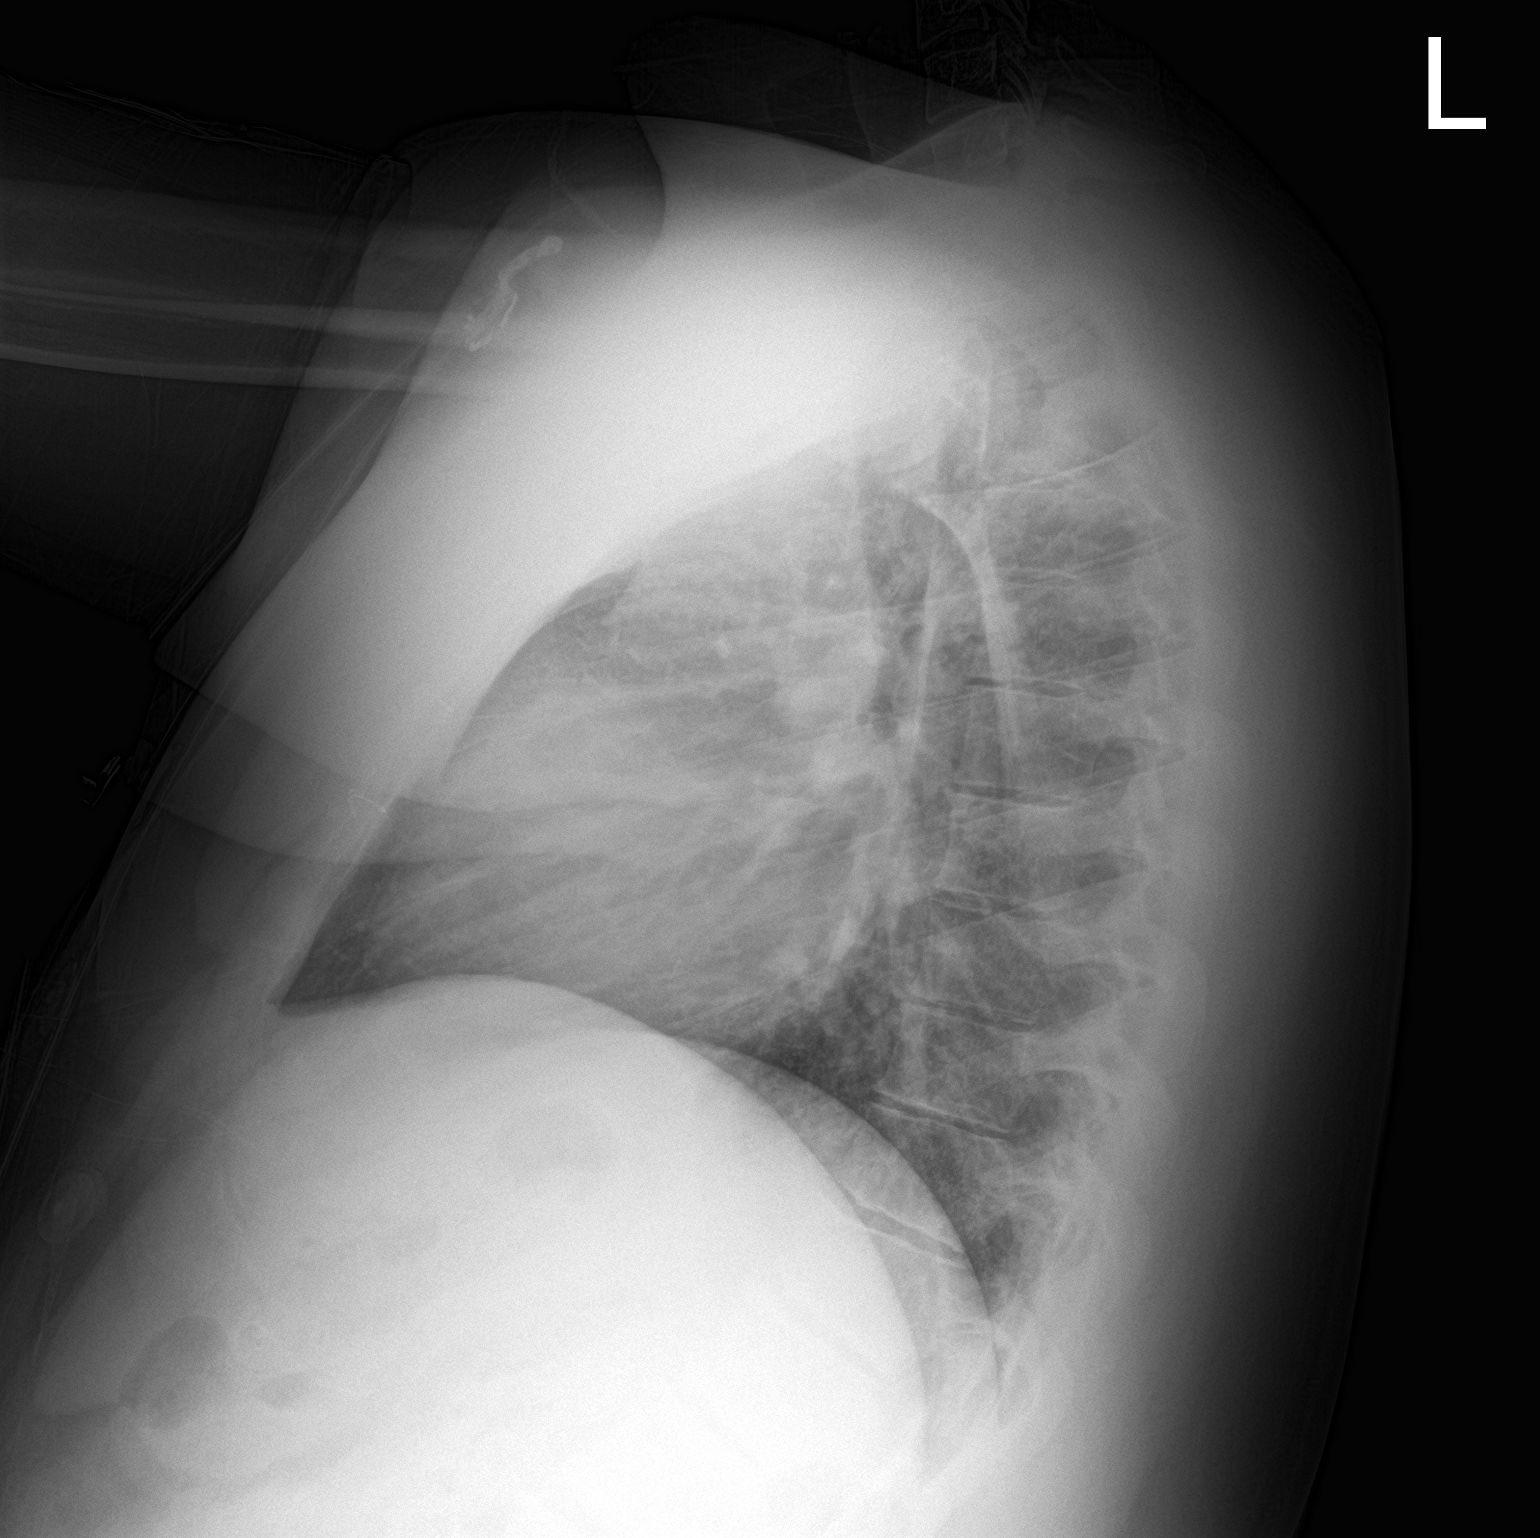

[2 of 2 positions shown; findings below may reference images not displayed]

FINDINGS: Remote median sternotomy. Upper normal heart size with stable
mediastinal contours. No pulmonary edema, pleural effusion, focal
airspace disease or pneumothorax. No acute osseous abnormalities are
seen.
IMPRESSION: 1. No acute chest findings.
2. Post remote median sternotomy.

## 2021-08-29 ENCOUNTER — Other Ambulatory Visit: Payer: Self-pay

## 2021-08-30 ENCOUNTER — Encounter: Payer: 59 | Admitting: Family Medicine

## 2021-08-30 NOTE — Progress Notes (Deleted)
Office Note 08/30/2021  CC: No chief complaint on file.   HPI:  Patient is a 20 y.o. male who is here for annual health maintenance exam.  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   Heart murmur    Migraine    Morbid obesity with BMI of 45.0-49.9, adult (HCC)    Phimosis 08/14/2017    Past Surgical History:  Procedure Laterality Date   AORTIC VALVE REPLACEMENT  2006    Family History  Problem Relation Age of Onset   Hypertension Mother    Arthritis Maternal Grandmother    Hypertension Maternal Grandmother    COPD Maternal Grandmother    Mental illness Maternal Grandmother    Heart disease Maternal Grandfather    Breast cancer Paternal Grandmother    COPD Paternal Grandmother     Social History   Socioeconomic History   Marital status: Single    Spouse name: Not on file   Number of children: Not on file   Years of education: Not on file   Highest education level: Not on file  Occupational History   Not on file  Tobacco Use   Smoking status: Former   Smokeless tobacco: Former  Building services engineer Use: Former   Quit date: 07/31/2020  Substance and Sexual Activity   Alcohol use: Yes    Alcohol/week: 2.0 standard drinks    Types: 2 Cans of beer per week    Comment: weekly   Drug use: Yes    Types: Marijuana   Sexual activity: Yes  Other Topics Concern   Not on file  Social History Narrative   Single,   Student: 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   Alc: weekends, couple beers.   Infrequent marijuana.   Social Determinants of Health   Financial Resource Strain: Not on file  Food Insecurity: Not on file  Transportation Needs: Not on file  Physical Activity: Not on file   Stress: Not on file  Social Connections: Not on file  Intimate Partner Violence: Not on file    Outpatient Medications Prior to Visit  Medication Sig Dispense Refill   cetirizine (ZYRTEC) 10 MG tablet Take by mouth.     famotidine (PEPCID) 20 MG tablet Take by mouth. (Patient not taking: Reported on 10/16/2020)     fluticasone (FLONASE) 50 MCG/ACT nasal spray Place into the nose.     omeprazole (PRILOSEC) 20 MG capsule Take by mouth. (Patient not taking: Reported on 10/16/2020)     No facility-administered medications prior to visit.    Allergies  Allergen Reactions   Buspar [Buspirone] Anaphylaxis    ROS *** PE; Vitals with BMI 06/19/2021 06/19/2021 06/19/2021  Height - - -  Weight - - -  BMI - - -  Systolic 122 117 725  Diastolic 77 69 81  Pulse 74 65 66     *** Pertinent labs:  Lab Results  Component Value Date   TSH 1.93 05/05/2020   Lab Results  Component Value Date   WBC 6.6 06/19/2021   HGB 15.2 06/19/2021   HCT 45.3 06/19/2021   MCV 87.5 06/19/2021   PLT 257 06/19/2021   Lab Results  Component Value Date   CREATININE 0.85 06/19/2021   BUN  12 06/19/2021   NA 137 06/19/2021   K 4.0 06/19/2021   CL 102 06/19/2021   CO2 28 06/19/2021   Lab Results  Component Value Date   ALT 30 06/19/2021   AST 23 06/19/2021   ALKPHOS 86 06/19/2021   BILITOT 0.5 06/19/2021   Lab Results  Component Value Date   CHOL 136 05/05/2020   Lab Results  Component Value Date   HDL 37 05/05/2020   Lab Results  Component Value Date   LDLCALC 68 05/05/2020   Lab Results  Component Value Date   TRIG 186 (A) 05/05/2020   Lab Results  Component Value Date   HGBA1C 5.1 05/05/2020   ASSESSMENT AND PLAN:   No problem-specific Assessment & Plan notes found for this encounter. Health maintenance exam: Reviewed age and gender appropriate health maintenance issues (prudent diet, regular exercise, health risks of tobacco and excessive alcohol, use of seatbelts, fire  alarms in home, use of sunscreen).  Also reviewed age and gender appropriate health screening as well as vaccine recommendations. Vaccines: flu->***.  Otherwise all utd. Labs: normal cbc and cmet 06/2021.  ?FLP and TSH today? (No hx of elev gluc)    Recent labs 06/2021 (cbc and cmet)normal. ?do flp and tsh today  An After Visit Summary was printed and given to the patient.  FOLLOW UP:  No follow-ups on file.  Signed:  Santiago Bumpers, MD           08/30/2021

## 2021-10-18 ENCOUNTER — Encounter: Payer: Self-pay | Admitting: Family Medicine

## 2022-04-25 ENCOUNTER — Ambulatory Visit: Payer: 59 | Admitting: Family Medicine

## 2022-04-25 ENCOUNTER — Encounter: Payer: Self-pay | Admitting: Family Medicine

## 2022-04-25 VITALS — BP 111/72 | HR 91 | Temp 98.9°F | Ht 75.0 in | Wt 289.2 lb

## 2022-04-25 DIAGNOSIS — R109 Unspecified abdominal pain: Secondary | ICD-10-CM

## 2022-04-25 DIAGNOSIS — R634 Abnormal weight loss: Secondary | ICD-10-CM | POA: Diagnosis not present

## 2022-04-25 DIAGNOSIS — K143 Hypertrophy of tongue papillae: Secondary | ICD-10-CM

## 2022-04-25 DIAGNOSIS — K529 Noninfective gastroenteritis and colitis, unspecified: Secondary | ICD-10-CM | POA: Diagnosis not present

## 2022-04-25 LAB — CBC WITH DIFFERENTIAL/PLATELET
Basophils Absolute: 0 10*3/uL (ref 0.0–0.1)
Basophils Relative: 0.2 % (ref 0.0–3.0)
Eosinophils Absolute: 0 10*3/uL (ref 0.0–0.7)
Eosinophils Relative: 0.6 % (ref 0.0–5.0)
HCT: 49.3 % (ref 39.0–52.0)
Hemoglobin: 16.7 g/dL (ref 13.0–17.0)
Lymphocytes Relative: 12.9 % (ref 12.0–46.0)
Lymphs Abs: 0.9 10*3/uL (ref 0.7–4.0)
MCHC: 33.8 g/dL (ref 30.0–36.0)
MCV: 90.6 fl (ref 78.0–100.0)
Monocytes Absolute: 0.6 10*3/uL (ref 0.1–1.0)
Monocytes Relative: 8.4 % (ref 3.0–12.0)
Neutro Abs: 5.4 10*3/uL (ref 1.4–7.7)
Neutrophils Relative %: 77.9 % — ABNORMAL HIGH (ref 43.0–77.0)
Platelets: 202 10*3/uL (ref 150.0–400.0)
RBC: 5.44 Mil/uL (ref 4.22–5.81)
RDW: 13.6 % (ref 11.5–14.6)
WBC: 6.9 10*3/uL (ref 4.5–10.5)

## 2022-04-25 LAB — COMPREHENSIVE METABOLIC PANEL
ALT: 19 U/L (ref 0–53)
AST: 19 U/L (ref 0–37)
Albumin: 4.6 g/dL (ref 3.5–5.2)
Alkaline Phosphatase: 75 U/L (ref 39–117)
BUN: 9 mg/dL (ref 6–23)
CO2: 30 mEq/L (ref 19–32)
Calcium: 10.2 mg/dL (ref 8.4–10.5)
Chloride: 102 mEq/L (ref 96–112)
Creatinine, Ser: 0.93 mg/dL (ref 0.40–1.50)
GFR: 117.84 mL/min (ref >60.00)
Glucose, Bld: 78 mg/dL (ref 70–99)
Potassium: 3.8 mEq/L (ref 3.5–5.1)
Sodium: 140 mEq/L (ref 135–145)
Total Bilirubin: 1.8 mg/dL — ABNORMAL HIGH (ref 0.2–1.2)
Total Protein: 7.3 g/dL (ref 6.0–8.3)

## 2022-04-25 LAB — SEDIMENTATION RATE: Sed Rate: 3 mm/hr (ref 0–15)

## 2022-04-25 MED ORDER — HYOSCYAMINE SULFATE 0.125 MG PO TABS: 0.1250 mg | ORAL_TABLET | Freq: Four times a day (QID) | ORAL | 0 refills | Status: AC | PRN

## 2022-04-25 MED ORDER — NYSTATIN 100000 UNIT/ML MT SUSP: 5.0000 mL | Freq: Four times a day (QID) | OROMUCOSAL | 0 refills | Status: AC

## 2022-04-25 NOTE — Progress Notes (Signed)
OFFICE VISIT  04/25/2022  CC:  Chief Complaint  Patient presents with   Ritta Slot    Flared up more in the past month; would like rx for Fluconazole(Diflucan)   Patient is a 21 y.o. male who presents for multiple concerns  HPI:  #1 persistent dull white/yellowish film on his tongue, bad breath.  He vapes. He can scrape some of this off if he uses a tongue scraper vigorously.  #2 low back pain that started in the last several weeks after having bent over a lot to help a friend move. He is also sleeping on a friend's couch. No radiation down the legs.  He went to the chiropractor, no significant improvement.  #3 recurrent feeling of abdominal discomfort/upset stomach, mostly postprandial, often with a unformed and sometimes completely watery stool.  No blood in stool, no fevers, no focal abdominal pain.  No significant constipation lately. This has been a waxing and waning problem for years, worse in the last month.  In fact, appetite is down lately and although he had been purposely working out to try to lose weight up until a month ago, he has noted an additional loss of approximately 20 pounds in the last month.    Says he has never had an evaluation for his abdominal complaints in the past.  ROS as above, plus--> no CP, no SOB, no wheezing, no cough, no dizziness, no HAs, no rashes, no melena/hematochezia.  No polyuria or polydipsia.  No myalgias or arthralgias.  No focal weakness, paresthesias, or tremors.  No acute vision or hearing abnormalities.  No dysuria or unusual/new urinary urgency or frequency.  No recent changes in lower legs.   No palpitations.    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   Heart murmur    Migraine    Morbid obesity with BMI of 45.0-49.9, adult ([place])    Phimosis  08/14/2017    Past Surgical History:  Procedure Laterality Date   AORTIC VALVE REPLACEMENT  2006   Social History   Socioeconomic History   Marital status: Single    Spouse name: Not on file   Number of children: Not on file   Years of education: Not on file   Highest education level: Not on file  Occupational History   Not on file  Tobacco Use   Smoking status: Former   Smokeless tobacco: Former  Vaping Use   Vaping Use: Former   Quit date: 07/31/2020  Substance and Sexual Activity   Alcohol use: Yes    Alcohol/week: 2.0 standard drinks of alcohol    Types: 2 Cans of beer per week    Comment: weekly   Drug use: Yes    Types: Marijuana   Sexual activity: Yes  Other Topics Concern   Not on file  Social History Narrative   Single,   Student: college sophomore as of 08/2020 [employer] in [place].   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   Alc: weekends, couple beers.   Infrequent marijuana.   Social Determinants of Health   Financial Resource Strain: Not on file  Food Insecurity: Not on file  Transportation Needs: Not on file  Physical Activity: Not on file  Stress: Not on file  Social Connections: Not on file   Outpatient Medications Prior to Visit  Medication Sig Dispense Refill   cetirizine (ZYRTEC) 10 MG tablet Take by mouth. (Patient not taking: Reported on 04/25/2022)     famotidine (PEPCID) 20 MG tablet Take by mouth. (Patient not taking: Reported on 10/16/2020)     fluticasone (FLONASE) 50 MCG/ACT nasal spray Place into the nose. (Patient not taking: Reported on 04/25/2022)     omeprazole (PRILOSEC) 20 MG capsule Take by mouth. (Patient not taking: Reported on 10/16/2020)     No facility-administered medications prior to visit.    Allergies  Allergen Reactions   Buspar [Buspirone] Anaphylaxis    ROS As per HPI  PE:    04/25/2022   11:14 AM 06/19/2021    7:15 PM 06/19/2021    7:00 PM  Vitals with BMI  Height '6\' 3"'      Weight 289 lbs 3 oz    BMI 44.96    Systolic 759 163 846  Diastolic 72 77 69  Pulse 91 74 65     Physical Exam  General: Alert and well-appearing. Affect is pleasant, lucid thought and speech. Oral exam: Minimal dull white coating on the surface of the tongue.  No plaques on gingiva or buccal mucosa.  Throat is normal. Cardiovascular: Regular rhythm and rate without murmur. Abdomen is obese and soft, mild diffuse tenderness without rebound or guarding.  Bowel sounds normal.  No hepatosplenomegaly or bruit. Extremities: No edema. Skin: No pallor or jaundice.  LABS:  Last metabolic panel Lab Results  Component Value Date   GLUCOSE 85 06/19/2021   NA 137 06/19/2021   K 4.0 06/19/2021   CL 102 06/19/2021   CO2 28 06/19/2021   BUN 12 06/19/2021   CREATININE 0.85 06/19/2021   GFRNONAA >60 06/19/2021   CALCIUM 9.2 06/19/2021   PROT 7.3 06/19/2021   ALBUMIN 4.0 06/19/2021   BILITOT 0.5 06/19/2021   ALKPHOS 86 06/19/2021   AST 23 06/19/2021   ALT 30 06/19/2021   ANIONGAP 7 06/19/2021   IMPRESSION AND PLAN:  #1 tongue film. I think this is from his vaping habit. Encouraged cessation. He could have slight contribution from thrush.  Nystatin suspension, 1 teaspoon 4 times daily swish and spit prescribed.  #2 musculoskeletal low back pain. Discussed improvement in low back mechanics/posture, stretching and strengthening.  #3 abdominal pain and diarrhea.  Primarily postprandial and his description sounds like functional GI disorder. However his abnormal weight loss is concerning. We will check CBC with differential, c-Met, ESR, TTG IgA, and IgA levels. We will do trial of Levsin 0.125, 1-2 every 6 hours as needed, #30, no refill. Referral to gastroenterology for further expert evaluation.  An After Visit Summary was printed and given to the patient.  FOLLOW UP: Return in about 4 weeks (around 05/23/2022) for f/u GI probs.  Signed:  Crissie Sickles, MD           04/25/2022

## 2022-04-27 LAB — IGA: Immunoglobulin A: 227 mg/dL (ref 47–310)

## 2022-04-27 LAB — TISSUE TRANSGLUTAMINASE, IGA: (tTG) Ab, IgA: <1 U/mL

## 2022-04-29 ENCOUNTER — Telehealth: Payer: Self-pay

## 2022-04-29 NOTE — Telephone Encounter (Signed)
-----   Message from Jeoffrey Massed, MD sent at 04/29/2022 11:22 AM EDT ----- Can you add hepatitis C antibody, hepatitis B surface antigen, and hepatitis B core antibody IgM?  Diagnosis hyperbilirubinemia.  Thanks.

## 2022-04-30 LAB — HEPATITIS C ANTIBODY
Hepatitis C Ab: NONREACTIVE
SIGNAL TO CUT-OFF: 0.06 (ref <1.00)

## 2022-04-30 LAB — HEPATITIS B CORE ANTIBODY, IGM: Hep B C IgM: NONREACTIVE

## 2022-04-30 LAB — HEPATITIS B SURFACE ANTIGEN: Hepatitis B Surface Ag: NONREACTIVE
